# Patient Record
Sex: Female | Born: 2000 | Smoking: Never smoker
Health system: Southern US, Community
[De-identification: ages and names within clinical notes are randomized; demographics above are authoritative.]

## PROBLEM LIST (undated history)

## (undated) DIAGNOSIS — E282 Polycystic ovarian syndrome: Secondary | ICD-10-CM

---
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
## 2019-01-26 ENCOUNTER — Other Ambulatory Visit (HOSPITAL_COMMUNITY): Payer: Self-pay | Admitting: Gastroenterology

## 2019-01-26 ENCOUNTER — Other Ambulatory Visit: Payer: Self-pay | Admitting: Gastroenterology

## 2019-01-26 DIAGNOSIS — R1011 Right upper quadrant pain: Secondary | ICD-10-CM

## 2019-02-11 ENCOUNTER — Other Ambulatory Visit: Payer: Self-pay

## 2019-02-11 ENCOUNTER — Ambulatory Visit (HOSPITAL_COMMUNITY)
Admission: RE | Admit: 2019-02-11 | Discharge: 2019-02-11 | Disposition: A | Discharge status: Home / Self Care | Payer: 59 | Source: Ambulatory Visit | Attending: Gastroenterology | Admitting: Gastroenterology

## 2019-02-11 DIAGNOSIS — R1011 Right upper quadrant pain: Secondary | ICD-10-CM | POA: Insufficient documentation

## 2019-02-22 ENCOUNTER — Other Ambulatory Visit (HOSPITAL_COMMUNITY): Payer: Self-pay | Admitting: Gastroenterology

## 2019-02-22 ENCOUNTER — Other Ambulatory Visit: Payer: Self-pay | Admitting: Gastroenterology

## 2019-02-22 DIAGNOSIS — R110 Nausea: Secondary | ICD-10-CM

## 2019-03-04 ENCOUNTER — Encounter (HOSPITAL_COMMUNITY): Payer: 59

## 2019-03-04 ENCOUNTER — Encounter (HOSPITAL_COMMUNITY): Payer: Self-pay

## 2019-03-30 ENCOUNTER — Other Ambulatory Visit (HOSPITAL_COMMUNITY): Payer: Self-pay | Admitting: Gastroenterology

## 2019-03-30 DIAGNOSIS — R110 Nausea: Secondary | ICD-10-CM

## 2019-04-12 DIAGNOSIS — E282 Polycystic ovarian syndrome: Secondary | ICD-10-CM | POA: Insufficient documentation

## 2019-04-13 DIAGNOSIS — A749 Chlamydial infection, unspecified: Secondary | ICD-10-CM | POA: Insufficient documentation

## 2019-04-20 ENCOUNTER — Encounter: Payer: Self-pay | Admitting: Certified Nurse Midwife

## 2019-05-02 ENCOUNTER — Encounter (HOSPITAL_COMMUNITY): Payer: Self-pay

## 2019-05-02 ENCOUNTER — Encounter (HOSPITAL_COMMUNITY): Payer: 59

## 2019-11-15 DIAGNOSIS — M0760 Enteropathic arthropathies, unspecified site: Secondary | ICD-10-CM | POA: Insufficient documentation

## 2019-11-22 ENCOUNTER — Other Ambulatory Visit: Payer: Self-pay | Admitting: Oncology

## 2019-11-22 DIAGNOSIS — G459 Transient cerebral ischemic attack, unspecified: Secondary | ICD-10-CM

## 2019-12-11 ENCOUNTER — Ambulatory Visit
Admission: RE | Admit: 2019-12-11 | Discharge: 2019-12-11 | Disposition: A | Discharge status: Home / Self Care | Payer: 59 | Source: Ambulatory Visit | Attending: Oncology | Admitting: Oncology

## 2019-12-11 ENCOUNTER — Other Ambulatory Visit: Payer: Self-pay

## 2019-12-11 DIAGNOSIS — G459 Transient cerebral ischemic attack, unspecified: Secondary | ICD-10-CM

## 2019-12-11 MED ORDER — GADOBENATE DIMEGLUMINE 529 MG/ML IV SOLN
15.0000 mL | Freq: Once | INTRAVENOUS | Status: AC | PRN
  Administered 2019-12-11: 15 mL via INTRAVENOUS

## 2019-12-15 ENCOUNTER — Telehealth: Payer: Self-pay | Admitting: Hematology and Oncology

## 2019-12-15 NOTE — Telephone Encounter (Signed)
I received a call from the pt mom who is wanting a 2nd opinion for abdominal pain. Pt has been seen at [facility], records are in [facility]. She has been scheduled to see Dr. Al Pimple on 11/19 at 10am. Aware to arrive 30 minutes early.

## 2019-12-16 ENCOUNTER — Encounter: Payer: Self-pay | Admitting: Hematology and Oncology

## 2019-12-16 ENCOUNTER — Inpatient Hospital Stay
Payer: No Typology Code available for payment source | Attending: Hematology and Oncology | Admitting: Hematology and Oncology

## 2019-12-16 ENCOUNTER — Inpatient Hospital Stay: Payer: No Typology Code available for payment source

## 2019-12-16 ENCOUNTER — Telehealth: Payer: Self-pay

## 2019-12-16 ENCOUNTER — Other Ambulatory Visit: Payer: No Typology Code available for payment source

## 2019-12-16 ENCOUNTER — Other Ambulatory Visit: Payer: Self-pay

## 2019-12-16 VITALS — BP 104/69 | HR 76 | Temp 98.1°F | Resp 18 | Ht 67.0 in | Wt 181.0 lb

## 2019-12-16 DIAGNOSIS — E282 Polycystic ovarian syndrome: Secondary | ICD-10-CM | POA: Diagnosis not present

## 2019-12-16 DIAGNOSIS — R590 Localized enlarged lymph nodes: Secondary | ICD-10-CM

## 2019-12-16 DIAGNOSIS — F419 Anxiety disorder, unspecified: Secondary | ICD-10-CM | POA: Diagnosis not present

## 2019-12-16 DIAGNOSIS — Z803 Family history of malignant neoplasm of breast: Secondary | ICD-10-CM | POA: Insufficient documentation

## 2019-12-16 DIAGNOSIS — R112 Nausea with vomiting, unspecified: Secondary | ICD-10-CM | POA: Insufficient documentation

## 2019-12-16 DIAGNOSIS — R161 Splenomegaly, not elsewhere classified: Secondary | ICD-10-CM | POA: Insufficient documentation

## 2019-12-16 DIAGNOSIS — R197 Diarrhea, unspecified: Secondary | ICD-10-CM | POA: Diagnosis not present

## 2019-12-16 DIAGNOSIS — R109 Unspecified abdominal pain: Secondary | ICD-10-CM | POA: Insufficient documentation

## 2019-12-16 DIAGNOSIS — R6881 Early satiety: Secondary | ICD-10-CM | POA: Diagnosis not present

## 2019-12-16 LAB — CBC WITH DIFFERENTIAL/PLATELET
Abs Immature Granulocytes: 0.01 10*3/uL (ref 0.00–0.07)
Basophils Absolute: 0 10*3/uL (ref 0.0–0.1)
Basophils Relative: 1 %
Eosinophils Absolute: 0.1 10*3/uL (ref 0.0–0.5)
Eosinophils Relative: 1 %
HCT: 39.1 % (ref 36.0–46.0)
Hemoglobin: 13.6 g/dL (ref 12.0–15.0)
Immature Granulocytes: 0 %
Lymphocytes Relative: 29 %
Lymphs Abs: 1.9 10*3/uL (ref 0.7–4.0)
MCH: 30.5 pg (ref 26.0–34.0)
MCHC: 34.8 g/dL (ref 30.0–36.0)
MCV: 87.7 fL (ref 80.0–100.0)
Monocytes Absolute: 0.3 10*3/uL (ref 0.1–1.0)
Monocytes Relative: 4 %
Neutro Abs: 4.2 10*3/uL (ref 1.7–7.7)
Neutrophils Relative %: 65 %
Platelets: 185 10*3/uL (ref 150–400)
RBC: 4.46 MIL/uL (ref 3.87–5.11)
RDW: 11.9 % (ref 11.5–15.5)
WBC: 6.5 10*3/uL (ref 4.0–10.5)
nRBC: 0 % (ref 0.0–0.2)

## 2019-12-16 LAB — COMPREHENSIVE METABOLIC PANEL
ALT: 8 U/L (ref 0–44)
AST: 12 U/L — ABNORMAL LOW (ref 15–41)
Albumin: 3.7 g/dL (ref 3.5–5.0)
Alkaline Phosphatase: 59 U/L (ref 38–126)
Anion gap: 8 (ref 5–15)
BUN: 6 mg/dL (ref 6–20)
CO2: 24 mmol/L (ref 22–32)
Calcium: 9.1 mg/dL (ref 8.9–10.3)
Chloride: 108 mmol/L (ref 98–111)
Creatinine, Ser: 0.78 mg/dL (ref 0.44–1.00)
GFR, Estimated: >60 mL/min (ref >60)
Glucose, Bld: 98 mg/dL (ref 70–99)
Potassium: 4.1 mmol/L (ref 3.5–5.1)
Sodium: 140 mmol/L (ref 135–145)
Total Bilirubin: 0.5 mg/dL (ref 0.3–1.2)
Total Protein: 7.6 g/dL (ref 6.5–8.1)

## 2019-12-16 LAB — LACTATE DEHYDROGENASE: LDH: 149 U/L (ref 98–192)

## 2019-12-16 LAB — SEDIMENTATION RATE: Sed Rate: 14 mm/hr (ref 0–22)

## 2019-12-16 NOTE — Progress Notes (Signed)
[name]  Patient Care Team: Andrea Morning, DO as PCP - General (Family Medicine)  CHIEF COMPLAINTS/PURPOSE OF CONSULTATION:  Abdominal pain, nausea, vomiting and diarrhea.  ASSESSMENT & PLAN:  No problem-specific Assessment & Plan notes found for this encounter.  This is a very pleasant 19 year old female patient who arrived with her mom for second opinion regarding her recent abdominal imaging findings which showed extensive shotty mesenteric lymphadenopathy and some mild splenomegaly.  She went to see a hematologist at [facility] who did not think this was pathologic in nature and I recommended her to follow-up with gynecology and ordered some other labs to investigate.  She apparently was dissatisfied with the visit and was seeking additional care and hence made a follow-up here.  Ms. Andrea Cardenas tells me that she has not seen her primary care doctor in a long time does not see her gastroenterologist anymore.  She tells me that her nausea vomiting and diarrhea started about 4 years ago followed by left upper quadrant abdominal pain in her own words about 2 years ago.  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  Besides abdominal symptoms, she has lost some weight about 80 pounds 2 years ago. Examination today healthy-appearing female patient, no palpable cervical axillary inguinal lymphadenopathy.  No palpable splenomegaly or hepatomegaly.  Abdomen nontender on examination, no guarding or rigidity. I reviewed her labs completely normal CBC, CMP with slightly low albumin.  Some gingival changes are likely nutritional deficiencies.  She had extensive lab work-up done including autoimmune work-up, hypercoagulable work-up, most of the tests quite unremarkable, mildly elevated ESR noted at 29. She had CT abdominal imaging with renal protocol when she presented with acute  onset left upper quadrant abdominal pain which is worse than her baseline pain.  She was noted to have extensive shotty mesenteric lymphadenopathy of uncertain nature, likely reactive but repeat imaging was recommended to rule out malignancy.  She was also noted to have borderline splenomegaly measuring about 14 cm.  No imaging for comparison in our system but patient tells me that she had scan in [facility], ultrasound of the abdomen which have requested a copy of from her to compare.  Again she is quite new in our system hence it is hard to assess her previous weight and weight loss.  She and her mom are understandably frustrated about going from doctor to doctor without finding a cause for her symptoms, had an endoscopy and colonoscopy which were unremarkable according to the patient.  Patient became quite emotional when I tried to explain to her that we cannot prescribe narcotics without a clear reason for her pain and she may have to seek pain management referral if she would end up needing pain medication on a routine basis.  I have however promised her to investigate this a bit more with repeat imaging in December to look at the status of the lymphadenopathy and splenomegaly, ESR, CBC and heavy metal screen.  At this time my concern for malignancy is quite low.  Symptoms are chronic and she does not have any classical presentation of a lymphomatous process, CBC is completely normal.  I do believe that she may have some process which is noncancerous but unfortunately I cannot commit to prescribing narcotics unless the process is clearly hematological oncologic.  I tried to explain this to her.  I did encourage her to establish with another PCP for coordination of care.  Plan  Labs today Repeat CT chest abdomen pelvis with contrast imaging in December, encouraged hydration and discussed the risk of nephrotoxicity with diet.  She is agreeable to proceed. Request pain management referral from PCP, we  have given her some names for pain management doctors. If repeat imaging without any major concern for malignancy, would recommend that she follow-up with her primary care physician on a routine basis and return to hematology or oncology as needed. She had a question If she needed a splenectomy, I do not advocate for this at this time since I cannot correlate the cause of pain to a slightly larger spleen. Encouraged multivitamin supplementation given her poor oral intake Patient did not have genetic testing according to mom.  This may have to be addressed at a later date. Again the lack of malignant process at this time does not confer guarantee that she may not develop cancerous process at a later date.  She expressed understanding of all recommendations and will return to clinic in January.    Orders Placed This Encounter  Procedures  . CT CHEST ABDOMEN PELVIS W CONTRAST    Standing Status:   Future    Standing Expiration Date:   12/15/2020    Order Specific Question:   Preferred imaging location?    Answer:   [facility]    Order Specific Question:   Radiology Contrast Protocol - do NOT remove file path    Answer:   [email or web address]    Order Specific Question:   Is patient pregnant?    Answer:   No  . CBC with Differential/Platelet    Standing Status:   Standing    Number of Occurrences:   22    Standing Expiration Date:   12/15/2020  . Sedimentation rate    Standing Status:   Future    Number of Occurrences:   1    Standing Expiration Date:   12/15/2020  . Comprehensive metabolic panel    Standing Status:   Standing    Number of Occurrences:   33    Standing Expiration Date:   12/15/2020  . Heavy metals, blood    Standing Status:   Future    Number of Occurrences:   1    Standing Expiration Date:   12/15/2020    Order Specific Question:   [place] of residence?    Answer:   [place] [[id number]]  . Lactate dehydrogenase    Standing  Status:   Future    Number of Occurrences:   1    Standing Expiration Date:   12/15/2020     HISTORY OF PRESENTING ILLNESS:  Andrea Cardenas 19 y.o. female who was previously seen by my colleague Dr Federico Flake presents today for second opinion given some shotty lymphadenopathy and mild splenomegaly mentioned in one of the abdominal imaging.  This is a very pleasant 19 year old female patient who arrived with her mom to the appointment today for a second opinion.  She has seen Dr Federico Flake at [facility] in October 2021 given some abdominal imaging discussing about mesenteric lymphadenopathy and slightly large spleen.  She was dissatisfied with the visit because there was no clear solution to her symptoms she was referred to gynecology who did not know why she was referred.  She is a good historian.  She tells me that about 4 years ago was when she started noticing some abdominal symptoms which  she describes mostly as nausea, vomiting, diarrhe about once or twice a day with some bright red blood in her sleep.  She tells me that the nausea when she wakes up but can occasionally be in the middle of the night.  She has some vomiting once to thrice a week.  Sometimes the vomitus has food that she ate 24 hours ago versus can be just bile.  She associates nausea with food intake, eats very less because of this, lost about 80 pounds about 2 years ago because she was unable to eat very much. She then started having some abdominal pain in the left upper quadrant in her own words about 2 yrs ago. Again, it can be sharp or dull, variable, intensity 6/10 at minimum and 8/10 at maximum, severely impairing quality of life. Pain is aggravated by food, no specific relieving factors. She attributes blood in stool to hemorrhoids. Besides the above mentioned, she mentioned early satiety, but when asked to clarify if she doesn't eat because of nausea or because she feels full early, she said it varies. She says nothing works  except tramadol. She took tramadol in the past and 10 pills lasted her a month, and she was hoping we can give her some medication today. No international travel. No known heavy metal exposure. She mentioned and wondered why she has a spleen scar. She had endoscopy and colonoscopy which according to the patient showed no findings. She has well controlled anxiety, she says,mom and patient denies any stressors. Rest of the pertinent ROS reviewed and negative  [facility] Mother alive 61 CHEK2 positive breast cancer Father alive 3 doesn't go to doctors Brother alive 44 autism. Sister alive 60 well.   PAST MEDICAL HISTORY Past Medical History:  Diagnosis Date  . Chronic abdominal pain  . PCOS (polycystic ovarian syndrome)   SURGICAL HISTORY Past Surgical History:  Procedure Laterality Date  . WISDOM TOOTH EXTRACTION    REVIEW OF SYSTEMS:    Constitutional: Denies fevers, chills or abnormal night sweats Eyes: Denies blurriness of vision, double vision or watery eyes Ears, nose, mouth, throat, and face: Denies mucositis or sore throat Respiratory: Denies cough, dyspnea or wheezes Cardiovascular: Denies palpitation, chest discomfort or lower extremity swelling Gastrointestinal:  As mentioned in HPI Skin: Denies abnormal skin rashes Lymphatics: Denies new lymphadenopathy or easy bruising Neurological:Denies numbness, tingling or new weaknesses Behavioral/Psych: Mood is stable, no new changes, was tearful and emotional when she said no body would help her pain. All other systems were reviewed with the patient and are negative.  MEDICAL HISTORY:  History reviewed. No pertinent past medical history.  SURGICAL HISTORY: History reviewed. No pertinent surgical history.  SOCIAL HISTORY: Social History   Socioeconomic History  . Marital status: Unknown    Spouse name: Not on file  . Number of children: Not on file  . Years of education: Not on file  . Highest education level: Not on  file  Occupational History  . Not on file  Tobacco Use  . Smoking status: Never Smoker  Substance and Sexual Activity  . Alcohol use: Not on file  . Drug use: Not on file  . Sexual activity: Not on file  Other Topics Concern  . Not on file  Social History Narrative  . Not on file   Social Determinants of Health   Financial Resource Strain:   . Difficulty of Paying Living Expenses: Not on file  Food Insecurity:   . Worried About Charity fundraiser in  the Last Year: Not on file  . Ran Out of Food in the Last Year: Not on file  Transportation Needs:   . Lack of Transportation (Medical): Not on file  . Lack of Transportation (Non-Medical): Not on file  Physical Activity:   . Days of Exercise per Week: Not on file  . Minutes of Exercise per Session: Not on file  Stress:   . Feeling of Stress : Not on file  Social Connections:   . Frequency of Communication with Friends and Family: Not on file  . Frequency of Social Gatherings with Friends and Family: Not on file  . Attends Religious Services: Not on file  . Active Member of Clubs or Organizations: Not on file  . Attends Archivist Meetings: Not on file  . Marital Status: Not on file  Intimate Partner Violence:   . Fear of Current or Ex-Partner: Not on file  . Emotionally Abused: Not on file  . Physically Abused: Not on file  . Sexually Abused: Not on file    FAMILY HISTORY:  [facility] Mother alive 48 CHEK2 positive breast cancer Father alive 34 doesn't go to doctors Brother alive 51 autism. Sister alive 31 well.   ALLERGIES:  has No Known Allergies.  MEDICATIONS:  Current Outpatient Medications  Medication Sig Dispense Refill  . etonogestrel-ethinyl estradiol (NUVARING) 0.12-0.015 MG/24HR vaginal ring Place vaginally.     No current facility-administered medications for this visit.     PHYSICAL EXAMINATION: ECOG PERFORMANCE STATUS: 0 - Asymptomatic  Vitals:   12/16/19 0931  BP: 104/69  Pulse: 76   Resp: 18  Temp: 98.1 F (36.7 C)  SpO2: 100%   Filed Weights   12/16/19 0931  Weight: 181 lb (82.1 kg)    GENERAL:alert, no distress and comfortable SKIN: skin color, texture, turgor are normal, no rashes or significant lesions EYES: normal, conjunctiva are pink and non-injected, sclera clear OROPHARYNX:no exudate, no erythema and lips, buccal mucosa, and tongue normal  NECK: supple, thyroid normal size, non-tender, without nodularity LYMPH:  no palpable lymphadenopathy in the cervical, axillary or inguinal LUNGS: clear to auscultation and percussion with normal breathing effort HEART: regular rate & rhythm and no murmurs and no lower extremity edema ABDOMEN:abdomen soft, non-tender and normal bowel sounds Musculoskeletal:no cyanosis of digits and no clubbing  PSYCH: alert & oriented x 3 with fluent speech NEURO: no focal motor/sensory deficits  LABORATORY DATA:  I have reviewed the data as listed Lab Results  Component Value Date   WBC 6.5 12/16/2019   HGB 13.6 12/16/2019   HCT 39.1 12/16/2019   MCV 87.7 12/16/2019   PLT 185 12/16/2019     Chemistry      Component Value Date/Time   NA 140 12/16/2019 1054   K 4.1 12/16/2019 1054   CL 108 12/16/2019 1054   CO2 24 12/16/2019 1054   BUN 6 12/16/2019 1054   CREATININE 0.78 12/16/2019 1054      Component Value Date/Time   CALCIUM 9.1 12/16/2019 1054   ALKPHOS 59 12/16/2019 1054   AST 12 (L) 12/16/2019 1054   ALT 8 12/16/2019 1054   BILITOT 0.5 12/16/2019 1054      RADIOGRAPHIC STUDIES: I have personally reviewed the radiological images as listed and agreed with the findings in the report. MR BRAIN W WO CONTRAST  Result Date: 12/11/2019 CLINICAL DATA:  Dizziness and episodes of peripheral vision loss. EXAM: MRI HEAD WITHOUT AND WITH CONTRAST TECHNIQUE: Multiplanar, multiecho pulse sequences of the brain and  surrounding structures were obtained without and with intravenous contrast. CONTRAST:  5m MULTIHANCE  GADOBENATE DIMEGLUMINE 529 MG/ML IV SOLN COMPARISON:  None. FINDINGS: Brain: No acute infarction, hemorrhage, hydrocephalus, extra-axial collection or mass lesion. The brain parenchyma has normal morphology and signal characteristics. No focus of abnormal contrast enhancement. Vascular: Normal flow voids. Skull and upper cervical spine: Normal marrow signal. Sinuses/Orbits: Mild mucosal thickening scattered throughout the paranasal sinuses, more pronounced in the bilateral ethmoid cells. The orbits are maintained. Other: None. IMPRESSION: 1. Unremarkable MRI of the brain. 2. Mild paranasal sinus disease. Electronically Signed   By: KPedro EarlsM.D.   On: 12/11/2019 18:38   11/04/19, CT abd pelvis renal protocol  This result has an attachment that is not available.  CLINICAL DATA: Left flank pain   CT Abdomen Pelvis Without Contrast (Renal Protocol)  [facility] Extensive shotty mesenteric adenopathy. This may be reactive,  inflammatory, or lymphoproliferative in nature. Given the associated  splenomegaly, short-term imaging follow-up may be helpful to  document resolution and exclude underlying neoplasm..    Electronically Signed  By: AFidela SalisburyMD  On: 11/04/2019 19:41   EXAM:  CT ABDOMEN AND PELVIS WITHOUT CONTRAST   TECHNIQUE:  Multidetector CT imaging of the abdomen and pelvis was performed  following the standard protocol without IV contrast.   COMPARISON: None.   FINDINGS:  Lower chest: The visualized lung bases are clear. The visualized  heart and pericardium are unremarkable peer   Hepatobiliary: No focal liver abnormality is seen. No gallstones,  gallbladder wall thickening, or biliary dilatation.   Pancreas: Unremarkable   Spleen: The spleen is mildly enlarged measuring 14.0 cm in greatest  dimension, best seen on coronal imaging. No intrasplenic lesion is  identified on this noncontrast examination.    Adrenals/Urinary Tract: Adrenal glands are unremarkable. Kidneys are  normal, without renal calculi, focal lesion, or hydronephrosis.  Bladder is unremarkable.   Stomach/Bowel: Stomach, small bowel, and large bowel are  unremarkable. Appendix normal. Trace free fluid within the pelvis is  nonspecific and may be physiologic in a female patient of this age.  No free intraperitoneal gas.   Vascular/Lymphatic: There is extensive shotty mesenteric adenopathy  with multiple lymph nodes measuring up to 10-11 mm in short axis  diameter. This is nonspecific and may be reactive, as can be seen  with infectious gastroenteritis, inflammatory, as can be seen with  mesenteric adenitis, or lymphoproliferative in nature. No pathologic  retroperitoneal or pelvic adenopathy is identified.   The abdominal vasculature is unremarkableon this noncontrast  examination.   Reproductive: A vaginal implant is in place. The pelvic organs are  otherwise unremarkable.   Other: Rectum unremarkable.   Musculoskeletal: No acute bone abnormality.   All questions were answered. The patient knows to call the clinic with any problems, questions or concerns. I spent a total of 45 minutes in the care of this patient including H and P, review of records, counseling and coordination of care.  [facility]     PBenay Pike MD 12/16/2019 11:59 AM

## 2019-12-16 NOTE — Telephone Encounter (Signed)
Pt called the Lab who relayed the message that pt needs a referral for pain management.  I spoke with the pt and advised her to follow-up and request a referral from her PCP. Pt states she has not seen her PCP in almost two years. I encouraged the pt to maybe schedule an appt or message her PCP's office with this request. Pt became upset and asked why we wouldn't just manage her pain. I reiterated to the pt that her PCP would manage her pain management and/or referral. Pt then abruptly disconnected the call.

## 2019-12-19 ENCOUNTER — Telehealth: Payer: Self-pay | Admitting: *Deleted

## 2019-12-19 NOTE — Telephone Encounter (Signed)
Attempted call to patient regarding sed rate results-norma. No answer but was able to leave vm message for pt to return call at her convenience.

## 2019-12-19 NOTE — Telephone Encounter (Signed)
-----   Message from Rachel Moulds, MD sent at 12/16/2019  2:25 PM EST ----- BethPlease let her know that her sed rate is normal now. Thanks,

## 2019-12-21 LAB — HEAVY METALS, BLOOD
Arsenic: 1 ug/L — ABNORMAL LOW (ref 2–23)
Lead: <1 ug/dL (ref 0–4)
Mercury: 1.4 ug/L (ref 0.0–14.9)

## 2020-01-13 DIAGNOSIS — M25561 Pain in right knee: Secondary | ICD-10-CM | POA: Insufficient documentation

## 2020-01-16 ENCOUNTER — Ambulatory Visit (HOSPITAL_COMMUNITY): Payer: No Typology Code available for payment source

## 2020-01-26 ENCOUNTER — Ambulatory Visit (HOSPITAL_COMMUNITY): Admission: RE | Admit: 2020-01-26 | Payer: No Typology Code available for payment source | Source: Ambulatory Visit

## 2020-02-02 ENCOUNTER — Telehealth: Payer: Self-pay

## 2020-02-02 ENCOUNTER — Other Ambulatory Visit: Payer: Self-pay

## 2020-02-02 DIAGNOSIS — R599 Enlarged lymph nodes, unspecified: Secondary | ICD-10-CM

## 2020-02-02 NOTE — Telephone Encounter (Signed)
The Patient called yesterday requesting to have her CT scan sent to [facility] instead of [facility]. I reordered the CT scan and sent that to [facility]. I advised the Patient to contact [facility] to schedule her CT scan and that we would need to cancel her appointment with Dr. Al Pimple set for tomorrow and reschedule for a time once the CT scan has been completed. The Patient verbalized understanding. A message has been sent to scheduling regarding this matter.

## 2020-02-02 NOTE — Progress Notes (Signed)
Opened in error

## 2020-02-03 ENCOUNTER — Inpatient Hospital Stay: Payer: No Typology Code available for payment source | Admitting: Hematology and Oncology

## 2020-02-21 ENCOUNTER — Ambulatory Visit
Admission: RE | Admit: 2020-02-21 | Discharge: 2020-02-21 | Disposition: A | Discharge status: Home / Self Care | Payer: No Typology Code available for payment source | Source: Ambulatory Visit | Attending: Hematology and Oncology | Admitting: Hematology and Oncology

## 2020-02-21 DIAGNOSIS — R599 Enlarged lymph nodes, unspecified: Secondary | ICD-10-CM

## 2020-02-21 MED ORDER — IOPAMIDOL (ISOVUE-300) INJECTION 61%
100.0000 mL | Freq: Once | INTRAVENOUS | Status: AC | PRN
  Administered 2020-02-21: 100 mL via INTRAVENOUS

## 2020-02-22 ENCOUNTER — Telehealth: Payer: Self-pay

## 2020-02-22 NOTE — Telephone Encounter (Signed)
Patient has been made aware of CT results and Dr. Remonia Richter message advising her to keep her appointment with [facility] to follow up there. The Patient verbalized understanding and states that she is feeling better.

## 2020-02-22 NOTE — Telephone Encounter (Signed)
-----   Message from Rachel Moulds, MD sent at 02/21/2020 10:32 PM EST ----- Baird Lyons, please convey the results to the patient. Splenomegaly is not concerning, mildly enlarged. Lymph nodes in the abdomen have significantly improved. At this time, I have no other recommendations but I am happy to help if they have any questions. I see they have an appointment at [facility] soon, I think that's good to keep it and follow there.

## 2021-05-04 IMAGING — MR MR HEAD WO/W CM
12 series · 48 of 48 positions shown · IV contrast (multihance)
Comparison: None.

CLINICAL DATA: Dizziness and episodes of peripheral vision loss.

EXAM:
MRI HEAD WITHOUT AND WITH CONTRAST
TECHNIQUE: Multiplanar, multiecho pulse sequences of the brain and surrounding
structures were obtained without and with intravenous contrast.
CONTRAST:  15mL MULTIHANCE GADOBENATE DIMEGLUMINE 529 MG/ML IV SOLN

[Series 2: t1_se_sag · sagittal · 5.0mm · 0.45mm/px · 1 of 27 slices shown]
[im 1/27]
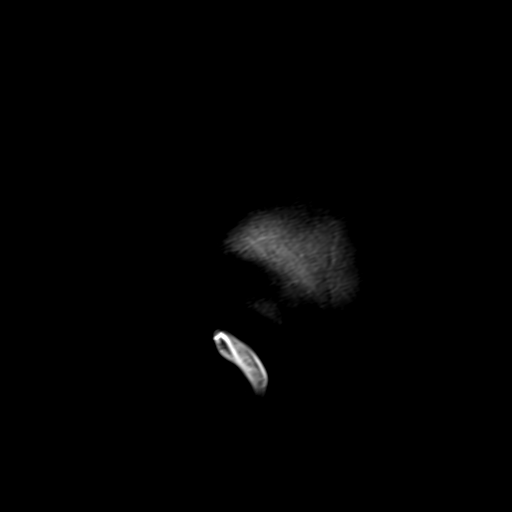

[Series 3: ep2d_diff_3 · axial · 3.0mm · 1.80mm/px · z∈[-59,+88]mm · 5 of 99 slices shown]
[im 1/99]
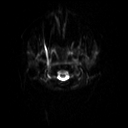
[im 25/99]
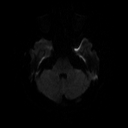
[im 50/99]
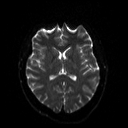
[im 74/99]
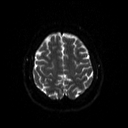
[im 99/99]
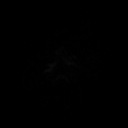

[Series 4: ep2d_diff_3_adc · axial · 3.0mm · 1.80mm/px · z∈[-59,+88]mm · 3 of 50 slices shown]
[im 1/50]
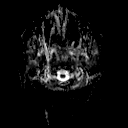
[im 25/50]
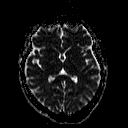
[im 50/50]
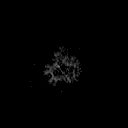

[Series 5: ep2d_diff_cor · coronal · 5.0mm · 1.77mm/px · 3 of 54 slices shown]
[im 1/54]
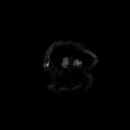
[im 27/54]
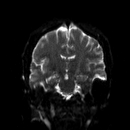
[im 54/54]
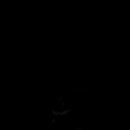

[Series 6: ep2d_diff_cor_adc · coronal · 5.0mm · 1.77mm/px · 2 of 27 slices shown]
[im 1/27]
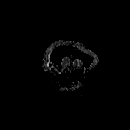
[im 27/27]
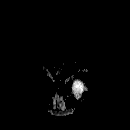

[Series 8: swi_images · axial · 2.0mm · 0.90mm/px · z∈[-58,+83]mm · 4 of 72 slices shown]
[im 1/72]
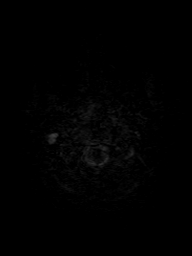
[im 24/72]
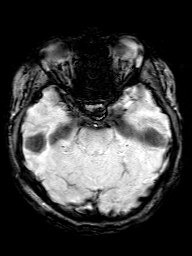
[im 48/72]
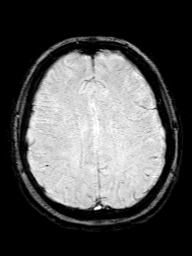
[im 72/72]
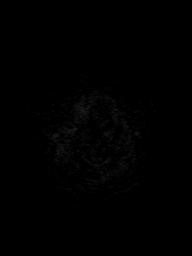

[Series 9: FLAIR · axial · 3.0mm · 0.43mm/px · z∈[-65,+90]mm · 2 of 27 slices shown]
[im 1/27]
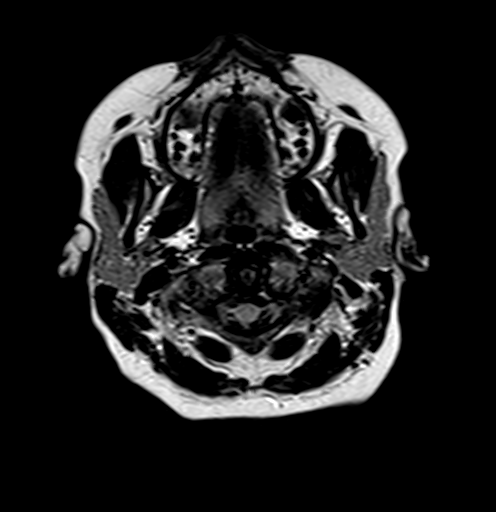
[im 27/27]
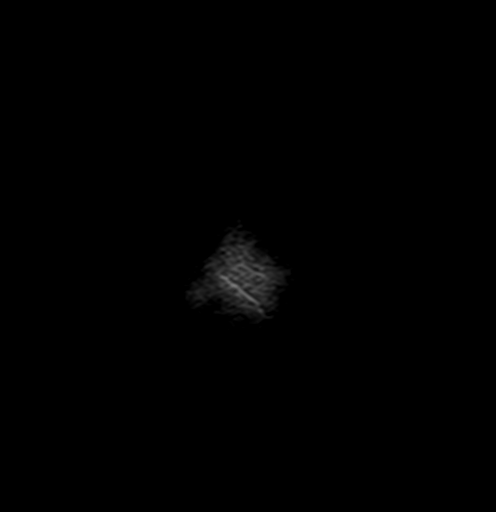

[Series 10: t2_tse_tra_512 · axial · 5.0mm · 0.60mm/px · z∈[-65,+90]mm · 2 of 27 slices shown]
[im 1/27]
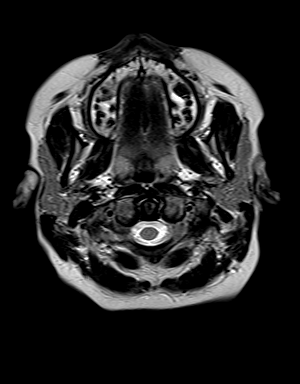
[im 27/27]
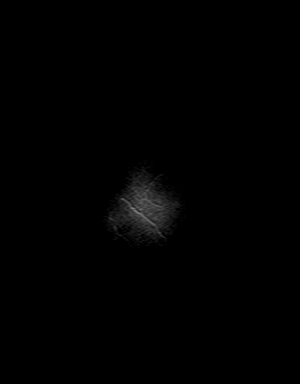

[Series 11: t1_mpr_tra · axial · 1.0mm · 0.72mm/px · z∈[-75,+100]mm · 11 of 176 slices shown]
[im 1/176]
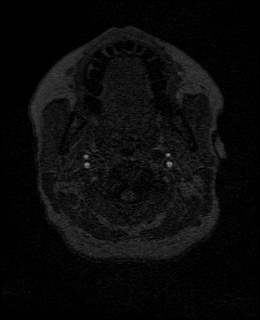
[im 18/176]
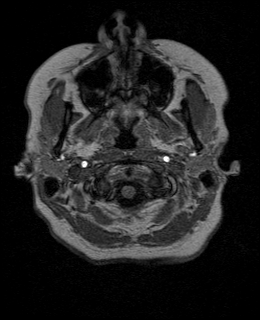
[im 36/176]
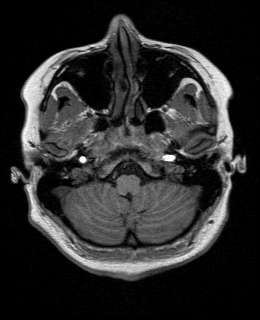
[im 53/176]
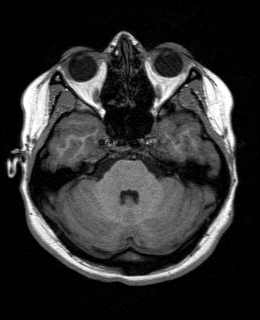
[im 71/176]
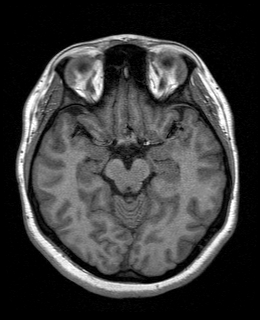
[im 88/176]
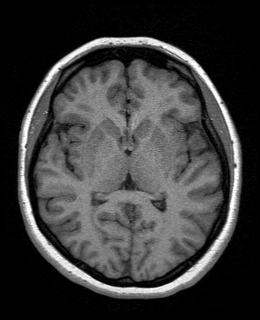
[im 106/176]
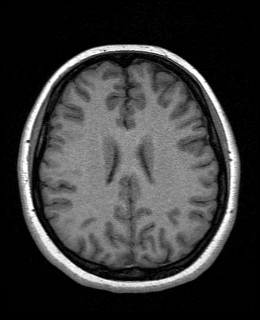
[im 123/176]
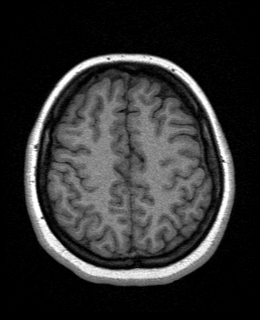
[im 141/176]
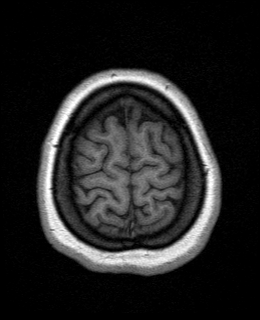
[im 158/176]
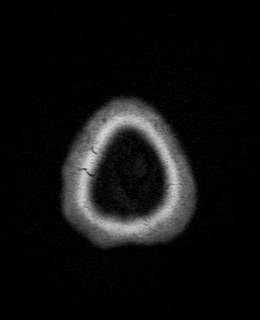
[im 176/176]
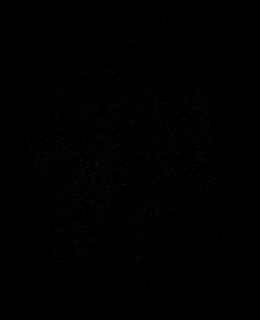

[Series 12: T2 · coronal · 5.0mm · 0.45mm/px · 2 of 30 slices shown]
[im 1/30]
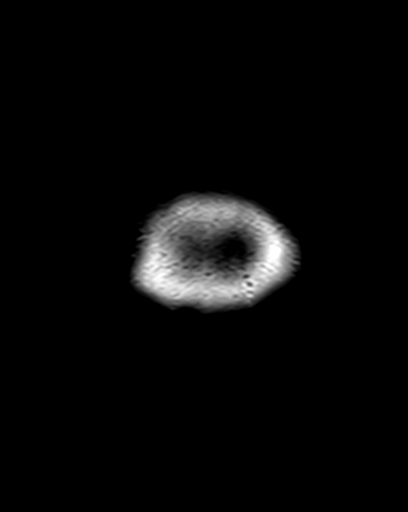
[im 30/30]
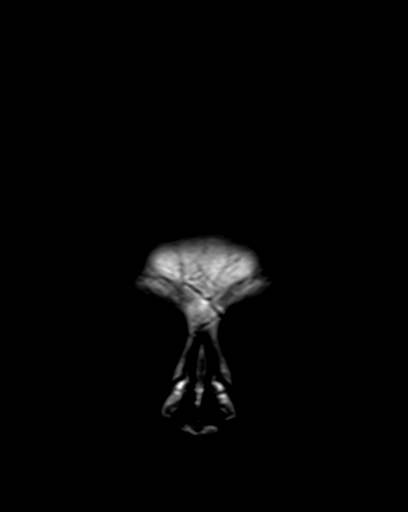

[Series 13: T1 post-contrast · coronal · 5.0mm · 0.72mm/px · 2 of 30 slices shown]
[im 1/30]
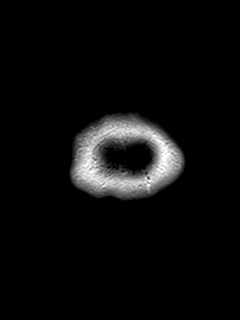
[im 30/30]
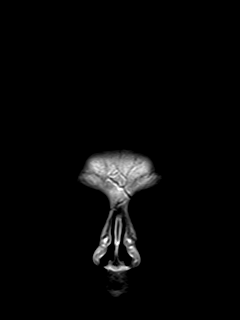

[Series 14: post t1_mpr_tra · axial · 1.0mm · 0.72mm/px · z∈[-75,+100]mm · 11 of 176 slices shown]
[im 1/176]
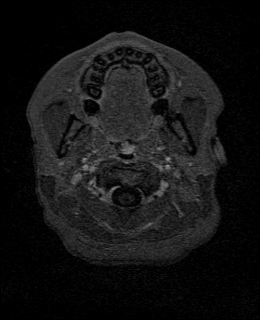
[im 18/176]
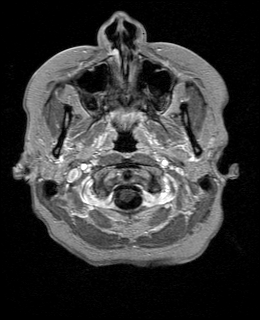
[im 36/176]
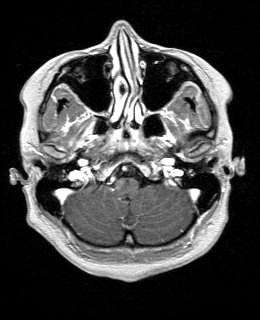
[im 53/176]
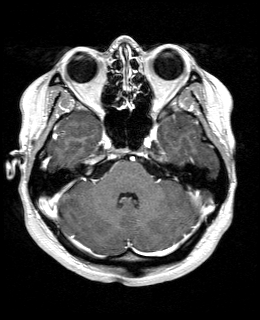
[im 71/176]
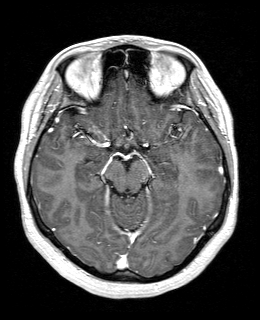
[im 88/176]
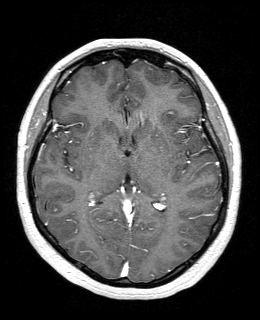
[im 106/176]
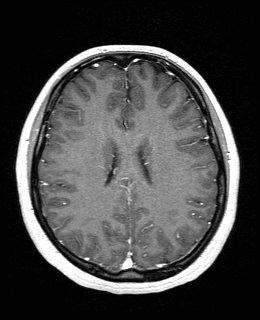
[im 123/176]
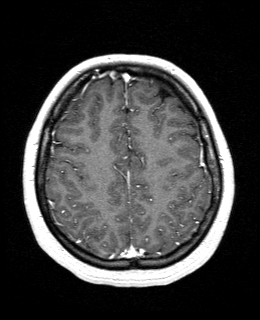
[im 141/176]
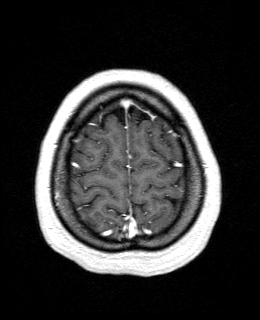
[im 158/176]
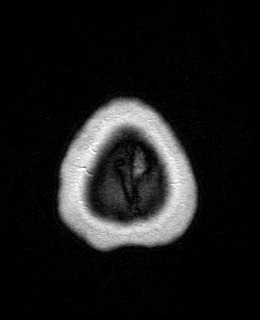
[im 176/176]
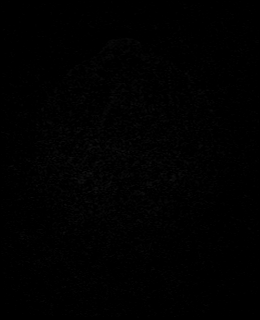

[48 of 48 positions shown; findings below may reference images not displayed]

FINDINGS: Brain: No acute infarction, hemorrhage, hydrocephalus, extra-axial
collection or mass lesion. The brain parenchyma has normal
morphology and signal characteristics. No focus of abnormal contrast
enhancement.

Vascular: Normal flow voids.

Skull and upper cervical spine: Normal marrow signal.

Sinuses/Orbits: Mild mucosal thickening scattered throughout the
paranasal sinuses, more pronounced in the bilateral ethmoid cells.
The orbits are maintained.

Other: None.
IMPRESSION: 1. Unremarkable MRI of the brain.
2. Mild paranasal sinus disease.

## 2022-03-13 NOTE — Progress Notes (Deleted)
  Andrea Cardenas Andrea Cardenas Andrea Cardenas Phone: [phone] Subjective:    I'm seeing this patient by the request  of:  Penni Bombard, PA  CC: joint pain   [id number]  Andrea Cardenas is a 22 y.o. female coming in with complaint of hypermobility  Onset-  Location Duration-  Character- Aggravating factors- Reliving factors-  Therapies tried-  Severity-   Ct scan in Jan showed decreasing lymph node size in 2022. Sed rate normal autoimmune labs in 2021 normal  On cymbalta   No past medical history on file. No past surgical history on file. Social History   Socioeconomic History   Marital status: Unknown    Spouse name: Not on file   Number of children: Not on file   Years of education: Not on file   Highest education level: Not on file  Occupational History   Not on file  Tobacco Use   Smoking status: Never   Smokeless tobacco: Not on file  Substance and Sexual Activity   Alcohol use: Not on file   Drug use: Not on file   Sexual activity: Not on file  Other Topics Concern   Not on file  Social History Narrative   Not on file   Social Determinants of Health   Financial Resource Strain: Not on file  Food Insecurity: Not on file  Transportation Needs: Not on file  Physical Activity: Not on file  Stress: Not on file  Social Connections: Not on file   No Known Allergies No family history on file.  Current Outpatient Medications (Endocrine & Metabolic):    etonogestrel-ethinyl estradiol (NUVARING) 0.12-0.015 MG/24HR vaginal ring, Place vaginally.        Reviewed prior external information including notes and imaging from  primary care provider As well as notes that were available from care everywhere and other healthcare systems.  Past medical history, social, surgical and family history all reviewed in electronic medical record.  No pertanent information unless stated regarding to the chief complaint.    Review of Systems:  No headache, visual changes, nausea, vomiting, diarrhea, constipation, dizziness, abdominal pain, skin rash, fevers, chills, night sweats, weight loss, swollen lymph nodes, body aches, joint swelling, chest pain, shortness of breath, mood changes. POSITIVE muscle aches  Objective  There were no vitals taken for this visit.   General: No apparent distress alert and oriented x3 mood and affect normal, dressed appropriately.  HEENT: Pupils equal, extraocular movements intact  Respiratory: Patient's speak in full sentences and does not appear short of breath  Cardiovascular: No lower extremity edema, non tender, no erythema      Impression and Recommendations:

## 2022-03-17 DIAGNOSIS — G43109 Migraine with aura, not intractable, without status migrainosus: Secondary | ICD-10-CM | POA: Insufficient documentation

## 2022-03-17 DIAGNOSIS — N3281 Overactive bladder: Secondary | ICD-10-CM | POA: Insufficient documentation

## 2022-03-17 DIAGNOSIS — F909 Attention-deficit hyperactivity disorder, unspecified type: Secondary | ICD-10-CM | POA: Insufficient documentation

## 2022-03-18 ENCOUNTER — Ambulatory Visit: Payer: 59 | Admitting: Family Medicine

## 2022-03-20 ENCOUNTER — Ambulatory Visit: Payer: Medicaid Other | Admitting: Cardiology

## 2022-04-24 ENCOUNTER — Ambulatory Visit: Payer: Medicaid Other | Admitting: Cardiology

## 2022-04-25 ENCOUNTER — Ambulatory Visit: Payer: Medicaid Other | Attending: Cardiology | Admitting: Cardiology

## 2022-11-01 ENCOUNTER — Encounter (HOSPITAL_COMMUNITY): Payer: Self-pay

## 2022-11-01 ENCOUNTER — Other Ambulatory Visit: Payer: Self-pay

## 2022-11-01 ENCOUNTER — Emergency Department (HOSPITAL_COMMUNITY)
Admission: EM | Admit: 2022-11-01 | Discharge: 2022-11-01 | Disposition: A | Discharge status: Home / Self Care | Payer: 59 | Attending: Emergency Medicine | Admitting: Emergency Medicine

## 2022-11-01 DIAGNOSIS — N939 Abnormal uterine and vaginal bleeding, unspecified: Secondary | ICD-10-CM | POA: Insufficient documentation

## 2022-11-01 DIAGNOSIS — N898 Other specified noninflammatory disorders of vagina: Secondary | ICD-10-CM

## 2022-11-01 HISTORY — DX: Polycystic ovarian syndrome: E28.2

## 2022-11-01 LAB — CBC WITH DIFFERENTIAL/PLATELET
Abs Immature Granulocytes: 0 10*3/uL (ref 0.00–0.07)
Basophils Absolute: 0 10*3/uL (ref 0.0–0.1)
Basophils Relative: 0 %
Eosinophils Absolute: 0.3 10*3/uL (ref 0.0–0.5)
Eosinophils Relative: 4 %
HCT: 38.1 % (ref 36.0–46.0)
Hemoglobin: 12.9 g/dL (ref 12.0–15.0)
Lymphocytes Relative: 20 %
Lymphs Abs: 1.7 10*3/uL (ref 0.7–4.0)
MCH: 31.2 pg (ref 26.0–34.0)
MCHC: 33.9 g/dL (ref 30.0–36.0)
MCV: 92 fL (ref 80.0–100.0)
Monocytes Absolute: 0.3 10*3/uL (ref 0.1–1.0)
Monocytes Relative: 4 %
Neutro Abs: 6 10*3/uL (ref 1.7–7.7)
Neutrophils Relative %: 72 %
Platelets: 204 10*3/uL (ref 150–400)
RBC: 4.14 MIL/uL (ref 3.87–5.11)
RDW: 12.2 % (ref 11.5–15.5)
WBC: 8.3 10*3/uL (ref 4.0–10.5)
nRBC: 0 % (ref 0.0–0.2)
nRBC: 0 /100{WBCs}

## 2022-11-01 LAB — BASIC METABOLIC PANEL
Anion gap: 12 (ref 5–15)
BUN: 9 mg/dL (ref 6–20)
CO2: 22 mmol/L (ref 22–32)
Calcium: 8.6 mg/dL — ABNORMAL LOW (ref 8.9–10.3)
Chloride: 104 mmol/L (ref 98–111)
Creatinine, Ser: 0.8 mg/dL (ref 0.44–1.00)
GFR, Estimated: >60 mL/min (ref >60)
Glucose, Bld: 90 mg/dL (ref 70–99)
Potassium: 3.7 mmol/L (ref 3.5–5.1)
Sodium: 138 mmol/L (ref 135–145)

## 2022-11-01 LAB — WET PREP, GENITAL
Clue Cells Wet Prep HPF POC: NONE SEEN
Sperm: NONE SEEN
Trich, Wet Prep: NONE SEEN
WBC, Wet Prep HPF POC: >=10 — AB (ref <10)
Yeast Wet Prep HPF POC: NONE SEEN

## 2022-11-01 LAB — RAPID HIV SCREEN (HIV 1/2 AB+AG)
HIV 1/2 Antibodies: NONREACTIVE
HIV-1 P24 Antigen - HIV24: NONREACTIVE

## 2022-11-01 LAB — POC URINE PREG, ED: Preg Test, Ur: NEGATIVE

## 2022-11-01 LAB — HCG, QUANTITATIVE, PREGNANCY: hCG, Beta Chain, Quant, S: <1 m[IU]/mL (ref <5)

## 2022-11-01 NOTE — ED Triage Notes (Signed)
Pt states she went to the bathroom this morning and noticed a piece of tissue with blood in the toilet. Pt states she has been having spotting for the past few weeks but had a negative pregnancy test at home. Pt went to [facility] and was told she was possibly having a miscarriage and was sent here. Pt denies abd pain.

## 2022-11-01 NOTE — Discharge Instructions (Addendum)
Please follow-up with your OB/GYN, if you do have an OB/GYN please follow-up with the [facility], or your primary care doctor.  The cause of your uterine bleeding, is unclear, there is no evidence of any kind of pregnancy, and we did test you for STDs.  If you are positive for gonorrhea or chlamydia, we will call you, and recommend that you get treated.  Return to the ER if you have heavy vaginal bleeding, pelvic pain, intractable nausea or vomiting or fever

## 2022-11-01 NOTE — ED Notes (Signed)
Lab called to add on UA to previously sent sample.

## 2022-11-01 NOTE — ED Provider Triage Note (Signed)
Emergency Medicine Provider Triage Evaluation Note  Andrea Cardenas , a 22 y.o. female  was evaluated in triage.  Pt complains of vaginal bleeding.  She reports missing her most recent period.  She did take a pregnancy test about 3 weeks ago that was negative.  She has had spotting over the past week with a larger "gush" of blood today.  She thinks that she may have passed some tissue.  She went to an urgent care and was told that she may be having a miscarriage and was directed to the emergency department.  No current pain or cramping although she has had some intermittent back pain and cramping over the past 1 week.  She uses NuvaRing for contraception.  Review of Systems  Positive: Vaginal bleeding Negative: Abdominal pain  Physical Exam  BP (!) 149/106 (BP Location: Right Arm)   Pulse 76   Temp 98.6 F (37 C)   Resp 20   Ht 5\' 6"  (1.676 m)   Wt 71.7 kg   LMP 07/23/2022 (Approximate) Comment: nuvaring for the past 2 months  SpO2 100%   BMI 25.50 kg/m  Gen:   Awake, no distress   Resp:  Normal effort  MSK:   Moves extremities without difficulty  Other:    Medical Decision Making  Medically screening exam initiated at 11:54 AM.  Appropriate orders placed.  Andrea Cardenas was informed that the remainder of the evaluation will be completed by another provider, this initial triage assessment does not replace that evaluation, and the importance of remaining in the ED until their evaluation is complete.  UPT pending   Renne Crigler, PA-C 11/01/22 1155

## 2022-11-01 NOTE — ED Provider Notes (Signed)
[place] EMERGENCY DEPARTMENT AT [facility] Provider Note   CSN: [id number] Arrival date & time: 11/01/22  1130     History  Chief Complaint  Patient presents with   Vaginal Bleeding    Andrea Cardenas is a 22 y.o. female, who presents to the ED secondary to concerns for miscarriage.  She states that she is sexually active, and has not had her period since the last week of June.  Since then she has started to being on the NuvaRing, which she was previously on.  Was having a little bit of discharge, but overall things were going well.  Was not having her period, she was skipping her menstruation week.  Notes that for the last week she has had spotting, and then today she felt a gush, when she went to the toilet.  Notes that she felt like she passed something that look like skin.  Went to urgent care and was told that she was likely having a miscarriage when she presented them with the tissue. Reports she had a little bit of cramping 2 days ago, but denies any current pain.    Home Medications Prior to Admission medications   Medication Sig Start Date End Date Taking? Authorizing Provider  etonogestrel-ethinyl estradiol (NUVARING) 0.12-0.015 MG/24HR vaginal ring Place vaginally.    [provider]      Allergies    Patient has no known allergies.    Review of Systems   Review of Systems  Genitourinary:  Positive for vaginal bleeding and vaginal discharge. Negative for difficulty urinating, dyspareunia and frequency.    Physical Exam Updated Vital Signs BP (!) 149/106 (BP Location: Right Arm)   Pulse 76   Temp 98.6 F (37 C)   Resp 20   Ht 5\' 6"  (1.676 m)   Wt 71.7 kg   LMP 07/23/2022 (Approximate) Comment: nuvaring for the past 2 months  SpO2 100%   BMI 25.50 kg/m  Physical Exam Vitals and nursing note reviewed. Exam conducted with a chaperone present.  Constitutional:      General: She is not in acute distress.    Appearance: She is  well-developed.  HENT:     Head: Normocephalic and atraumatic.  Eyes:     Conjunctiva/sclera: Conjunctivae normal.  Cardiovascular:     Rate and Rhythm: Normal rate and regular rhythm.     Heart sounds: No murmur heard. Pulmonary:     Effort: Pulmonary effort is normal. No respiratory distress.     Breath sounds: Normal breath sounds.  Abdominal:     Palpations: Abdomen is soft.     Tenderness: There is no abdominal tenderness.  Genitourinary:    Exam position: Knee-chest position.     Vagina: Normal.     Cervix: Discharge present. No cervical motion tenderness, friability, erythema or eversion.     Comments: +thick yellowish blood tinged discharge from cervix Musculoskeletal:        General: No swelling.     Cervical back: Neck supple.  Skin:    General: Skin is warm and dry.     Capillary Refill: Capillary refill takes less than 2 seconds.  Neurological:     Mental Status: She is alert.  Psychiatric:        Mood and Affect: Mood normal.     ED Results / Procedures / Treatments   Labs (all labs ordered are listed, but only abnormal results are displayed) Labs Reviewed  WET PREP, GENITAL - Abnormal; Notable for the  following components:      Result Value   WBC, Wet Prep HPF POC >=10 (*)    All other components within normal limits  BASIC METABOLIC PANEL - Abnormal; Notable for the following components:   Calcium 8.6 (*)    All other components within normal limits  CBC WITH DIFFERENTIAL/PLATELET  HCG, QUANTITATIVE, PREGNANCY  RAPID HIV SCREEN (HIV 1/2 AB+AG)  URINALYSIS, ROUTINE W REFLEX MICROSCOPIC  POC URINE PREG, ED  GC/CHLAMYDIA PROBE AMP ([facility]) NOT AT [facility]    EKG None  Radiology No results found.  Procedures Procedures    Medications Ordered in ED Medications - No data to display  ED Course/ Medical Decision Making/ A&P                                 Medical Decision Making Patient is a 22 year old female, here for vaginal bleeding,  abnormal tissue, concern for miscarriage.  Last menstrual period was in late June, I performed a pelvic exam, there is no foreign body in the vaginal cavity except for thick yellowish discharge, blood present.  I asked her if she wore tampons, and she states she is but she has been taking the amount religiously and is sure that 1 is not stuck in there.  I do not see any evidence of a string.  We will obtain beta-hCG, CMP, CBC for further evaluation.  Amount and/or Complexity of Data Reviewed Labs: ordered.    Details: Negative for pregnancy Discussion of management or test interpretation with external provider(s): Discussed with patient, negative wet prep, negative for pregnancy, as well, we sent GC chlamydia off, she states she is not concerned for having an STD, but would like to be tested.  We will have her follow-up with OB/GYN, mucous plug possible, with slight bleeding, breakthrough secondary to use of NuvaRing.  We discussed emergent return precautions and she voiced understanding    Final Clinical Impression(s) / ED Diagnoses Final diagnoses:  Abnormal uterine bleeding (AUB)  Vaginal discharge    Rx / DC Orders ED Discharge Orders     None         Adhrit Krenz, Harley [place], PA 11/01/22 1442    Derwood Kaplan, MD 11/02/22 [phone]

## 2022-11-03 LAB — GC/CHLAMYDIA PROBE AMP (~~LOC~~) NOT AT ARMC
Chlamydia: NEGATIVE
Comment: NEGATIVE
Comment: NORMAL
Neisseria Gonorrhea: NEGATIVE

## 2023-03-06 ENCOUNTER — Other Ambulatory Visit: Payer: Self-pay

## 2023-03-06 ENCOUNTER — Other Ambulatory Visit (HOSPITAL_BASED_OUTPATIENT_CLINIC_OR_DEPARTMENT_OTHER): Payer: Self-pay

## 2023-03-06 MED ORDER — LISDEXAMFETAMINE DIMESYLATE 60 MG PO CAPS
60.0000 mg | ORAL_CAPSULE | Freq: Every day | ORAL | 0 refills | Status: DC
  Filled 2023-03-06: qty 30, 30d supply, fill #0

## 2023-04-10 ENCOUNTER — Encounter (INDEPENDENT_AMBULATORY_CARE_PROVIDER_SITE_OTHER): Payer: Self-pay

## 2023-04-13 ENCOUNTER — Other Ambulatory Visit (HOSPITAL_BASED_OUTPATIENT_CLINIC_OR_DEPARTMENT_OTHER): Payer: Self-pay

## 2023-04-13 MED ORDER — LISDEXAMFETAMINE DIMESYLATE 60 MG PO CAPS
60.0000 mg | ORAL_CAPSULE | Freq: Every morning | ORAL | 0 refills | Status: DC
  Filled 2023-06-23: qty 30, 30d supply, fill #0

## 2023-04-13 MED ORDER — LISDEXAMFETAMINE DIMESYLATE 60 MG PO CAPS
60.0000 mg | ORAL_CAPSULE | Freq: Every morning | ORAL | 0 refills | Status: DC
  Filled 2023-04-13: qty 30, 30d supply, fill #0

## 2023-04-13 MED ORDER — LISDEXAMFETAMINE DIMESYLATE 60 MG PO CAPS
60.0000 mg | ORAL_CAPSULE | Freq: Every morning | ORAL | 0 refills | Status: DC
  Filled 2023-05-13: qty 30, 30d supply, fill #0

## 2023-05-12 ENCOUNTER — Other Ambulatory Visit (HOSPITAL_BASED_OUTPATIENT_CLINIC_OR_DEPARTMENT_OTHER): Payer: Self-pay

## 2023-05-13 ENCOUNTER — Other Ambulatory Visit (HOSPITAL_BASED_OUTPATIENT_CLINIC_OR_DEPARTMENT_OTHER): Payer: Self-pay

## 2023-06-23 ENCOUNTER — Other Ambulatory Visit (HOSPITAL_BASED_OUTPATIENT_CLINIC_OR_DEPARTMENT_OTHER): Payer: Self-pay

## 2023-06-23 ENCOUNTER — Other Ambulatory Visit (HOSPITAL_COMMUNITY): Payer: Self-pay

## 2023-08-13 ENCOUNTER — Other Ambulatory Visit (HOSPITAL_BASED_OUTPATIENT_CLINIC_OR_DEPARTMENT_OTHER): Payer: Self-pay

## 2023-08-13 MED ORDER — LISDEXAMFETAMINE DIMESYLATE 70 MG PO CAPS
70.0000 mg | ORAL_CAPSULE | Freq: Every day | ORAL | 0 refills | Status: DC
  Filled 2023-08-13: qty 30, 30d supply, fill #0

## 2023-08-18 ENCOUNTER — Other Ambulatory Visit (HOSPITAL_BASED_OUTPATIENT_CLINIC_OR_DEPARTMENT_OTHER): Payer: Self-pay

## 2023-09-15 ENCOUNTER — Other Ambulatory Visit (HOSPITAL_BASED_OUTPATIENT_CLINIC_OR_DEPARTMENT_OTHER): Payer: Self-pay

## 2023-09-15 MED ORDER — BUSPIRONE HCL 7.5 MG PO TABS
7.5000 mg | ORAL_TABLET | Freq: Two times a day (BID) | ORAL | 1 refills | Status: DC
  Filled 2023-09-15 – 2023-10-06 (×2): qty 60, 30d supply, fill #0

## 2023-09-15 MED ORDER — LISDEXAMFETAMINE DIMESYLATE 70 MG PO CAPS
70.0000 mg | ORAL_CAPSULE | Freq: Every day | ORAL | 0 refills | Status: DC
  Filled 2023-09-15 – 2023-10-06 (×2): qty 30, 30d supply, fill #0

## 2023-09-25 ENCOUNTER — Other Ambulatory Visit (HOSPITAL_BASED_OUTPATIENT_CLINIC_OR_DEPARTMENT_OTHER): Payer: Self-pay

## 2023-09-25 MED ORDER — ETONOGESTREL-ETHINYL ESTRADIOL 0.12-0.015 MG/24HR VA RING
VAGINAL_RING | VAGINAL | 0 refills | Status: AC
  Filled 2023-09-25 – 2023-09-29 (×2): qty 3, 28d supply, fill #0
  Filled 2023-12-17: qty 3, 84d supply, fill #1
  Filled 2023-12-18: qty 1, 28d supply, fill #1

## 2023-09-27 ENCOUNTER — Other Ambulatory Visit (HOSPITAL_BASED_OUTPATIENT_CLINIC_OR_DEPARTMENT_OTHER): Payer: Self-pay

## 2023-09-27 MED ORDER — FLUCONAZOLE 150 MG PO TABS
150.0000 mg | ORAL_TABLET | Freq: Once | ORAL | 0 refills | Status: AC
  Filled 2023-09-27 – 2023-09-29 (×2): qty 1, 1d supply, fill #0

## 2023-09-28 ENCOUNTER — Other Ambulatory Visit (HOSPITAL_BASED_OUTPATIENT_CLINIC_OR_DEPARTMENT_OTHER): Payer: Self-pay

## 2023-09-29 ENCOUNTER — Other Ambulatory Visit (HOSPITAL_BASED_OUTPATIENT_CLINIC_OR_DEPARTMENT_OTHER): Payer: Self-pay

## 2023-09-29 ENCOUNTER — Other Ambulatory Visit: Payer: Self-pay

## 2023-10-06 ENCOUNTER — Other Ambulatory Visit: Payer: Self-pay

## 2023-10-06 ENCOUNTER — Other Ambulatory Visit (HOSPITAL_BASED_OUTPATIENT_CLINIC_OR_DEPARTMENT_OTHER): Payer: Self-pay

## 2023-10-06 ENCOUNTER — Other Ambulatory Visit (HOSPITAL_COMMUNITY): Payer: Self-pay

## 2023-10-06 MED ORDER — VITAMIN D3 1.25 MG (50000 UT) PO CAPS
50000.0000 [IU] | ORAL_CAPSULE | ORAL | 0 refills | Status: AC
Start: 2023-10-05 — End: ?
  Filled 2023-10-06: qty 12, 84d supply, fill #0

## 2023-10-07 ENCOUNTER — Other Ambulatory Visit (HOSPITAL_BASED_OUTPATIENT_CLINIC_OR_DEPARTMENT_OTHER): Payer: Self-pay

## 2023-10-07 MED ORDER — AMOXICILLIN-POT CLAVULANATE 875-125 MG PO TABS
1.0000 | ORAL_TABLET | Freq: Two times a day (BID) | ORAL | 0 refills | Status: DC
  Filled 2023-10-07: qty 20, 10d supply, fill #0

## 2023-10-09 ENCOUNTER — Encounter (INDEPENDENT_AMBULATORY_CARE_PROVIDER_SITE_OTHER): Payer: Self-pay

## 2023-10-13 ENCOUNTER — Other Ambulatory Visit (HOSPITAL_BASED_OUTPATIENT_CLINIC_OR_DEPARTMENT_OTHER): Payer: Self-pay

## 2023-10-13 ENCOUNTER — Encounter (HOSPITAL_COMMUNITY): Payer: Self-pay

## 2023-10-13 ENCOUNTER — Emergency Department (HOSPITAL_COMMUNITY)
Admission: EM | Admit: 2023-10-13 | Discharge: 2023-10-14 | Disposition: A | Discharge status: Home / Self Care | Payer: Self-pay | Attending: Emergency Medicine | Admitting: Emergency Medicine

## 2023-10-13 DIAGNOSIS — R55 Syncope and collapse: Secondary | ICD-10-CM | POA: Diagnosis present

## 2023-10-13 LAB — CBC
HCT: 35.8 % — ABNORMAL LOW (ref 36.0–46.0)
Hemoglobin: 12.1 g/dL (ref 12.0–15.0)
MCH: 31.3 pg (ref 26.0–34.0)
MCHC: 33.8 g/dL (ref 30.0–36.0)
MCV: 92.5 fL (ref 80.0–100.0)
Platelets: 232 K/uL (ref 150–400)
RBC: 3.87 MIL/uL (ref 3.87–5.11)
RDW: 12.4 % (ref 11.5–15.5)
WBC: 8.3 K/uL (ref 4.0–10.5)
nRBC: 0 % (ref 0.0–0.2)

## 2023-10-13 LAB — CBG MONITORING, ED: Glucose-Capillary: 101 mg/dL — ABNORMAL HIGH (ref 70–99)

## 2023-10-13 MED ORDER — FLUCONAZOLE 150 MG PO TABS
150.0000 mg | ORAL_TABLET | Freq: Once | ORAL | 0 refills | Status: AC
  Filled 2023-10-13: qty 1, 1d supply, fill #0

## 2023-10-13 NOTE — ED Triage Notes (Signed)
 Pt comes via [facility] from home for dizziness and near syncopal episode, upon EMS arrival BP was 90/60 now 130 systolic, pt is on antibiotics for strep

## 2023-10-14 LAB — COMPREHENSIVE METABOLIC PANEL WITH GFR
ALT: 17 U/L (ref 0–44)
AST: 16 U/L (ref 15–41)
Albumin: 3.8 g/dL (ref 3.5–5.0)
Alkaline Phosphatase: 58 U/L (ref 38–126)
Anion gap: 12 (ref 5–15)
BUN: 15 mg/dL (ref 6–20)
CO2: 21 mmol/L — ABNORMAL LOW (ref 22–32)
Calcium: 8.7 mg/dL — ABNORMAL LOW (ref 8.9–10.3)
Chloride: 106 mmol/L (ref 98–111)
Creatinine, Ser: 0.64 mg/dL (ref 0.44–1.00)
GFR, Estimated: >60 mL/min (ref >60)
Glucose, Bld: 101 mg/dL — ABNORMAL HIGH (ref 70–99)
Potassium: 4 mmol/L (ref 3.5–5.1)
Sodium: 139 mmol/L (ref 135–145)
Total Bilirubin: 0.5 mg/dL (ref 0.0–1.2)
Total Protein: 6.7 g/dL (ref 6.5–8.1)

## 2023-10-14 LAB — HCG, SERUM, QUALITATIVE: Preg, Serum: NEGATIVE

## 2023-10-14 NOTE — Discharge Instructions (Addendum)
 You were evaluated in the Emergency Department and after careful evaluation, we did not find any emergent condition requiring admission or further testing in the hospital.  Your exam/testing today is overall reassuring.  Recommend follow-up with cardiology if you continue to have syncopal or near syncopal episodes.  Drink plenty of water and take your time with change of positions to help prevent syncopal episodes.  Please return to the Emergency Department if you experience any worsening of your condition.   Thank you for allowing us  to be a part of your care.

## 2023-10-14 NOTE — ED Provider Notes (Signed)
 [facility]EMERGENCY DEPT [facility] Emergency Department Provider Note MRN:  [id number]  Arrival date & time: 10/14/23     Chief Complaint   Near Syncope   History of Present Illness   Andrea Cardenas is a 23 y.o. year-old female with no pertinent past medical history presenting to the ED with chief complaint of near syncope.  Patient was bending down to tend to the dog and then stood up and felt very lightheaded, vision went black, almost passed out.  Sat down and usually this helps but it did not really help.  Here for evaluation.  Has passed out completely 3 times in the past, has had very frequent near syncopal episodes.  Thinks she might have POTS.  Review of Systems  A thorough review of systems was obtained and all systems are negative except as noted in the HPI and PMH.   Patient's Health History    Past Medical History:  Diagnosis Date   PCOS (polycystic ovarian syndrome)     History reviewed. No pertinent surgical history.  History reviewed. No pertinent family history.  Social History   Socioeconomic History   Marital status: Unknown    Spouse name: Not on file   Number of children: Not on file   Years of education: Not on file   Highest education level: Not on file  Occupational History   Not on file  Tobacco Use   Smoking status: Never   Smokeless tobacco: Not on file  Substance and Sexual Activity   Alcohol use: Yes   Drug use: Never   Sexual activity: Not on file  Other Topics Concern   Not on file  Social History Narrative   Not on file   Social Drivers of Health   Financial Resource Strain: Not on file  Food Insecurity: Not on file  Transportation Needs: Not on file  Physical Activity: Not on file  Stress: Not on file  Social Connections: Unknown (06/11/2021)   Received from [facility]   Social Network    Social Network: Not on file  Intimate Partner Violence: Unknown (05/03/2021)   Received from [facility]   HITS    Physically  Hurt: Not on file    Insult or Talk Down To: Not on file    Threaten Physical Harm: Not on file    Scream or Curse: Not on file     Physical Exam   Vitals:   10/13/23 2310 10/13/23 2315  BP:  119/76  Pulse:  71  Resp:  16  Temp:  98 F (36.7 C)  SpO2: 100% 100%    CONSTITUTIONAL: Well-appearing, NAD NEURO/PSYCH:  Alert and oriented x 3, no focal deficits EYES:  eyes equal and reactive ENT/NECK:  no LAD, no JVD CARDIO: Regular rate, well-perfused, normal S1 and S2 PULM:  CTAB no wheezing or rhonchi GI/GU:  non-distended, non-tender MSK/SPINE:  No gross deformities, no edema SKIN:  no rash, atraumatic   *Additional and/or pertinent findings included in MDM below  Diagnostic and Interventional Summary    EKG Interpretation Date/Time:  Tuesday October 13 2023 23:21:46 EDT Ventricular Rate:  66 PR Interval:  137 QRS Duration:  99 QT Interval:  395 QTC Calculation: 414 R Axis:   79  Text Interpretation: Sinus rhythm Confirmed by Theadore Sharper [phone]) on 10/14/2023 2:00:20 AM       Labs Reviewed  COMPREHENSIVE METABOLIC PANEL WITH GFR - Abnormal; Notable for the following components:      Result Value   CO2  21 (*)    Glucose, Bld 101 (*)    Calcium 8.7 (*)    All other components within normal limits  CBC - Abnormal; Notable for the following components:   HCT 35.8 (*)    All other components within normal limits  CBG MONITORING, ED - Abnormal; Notable for the following components:   Glucose-Capillary 101 (*)    All other components within normal limits  HCG, SERUM, QUALITATIVE  URINALYSIS, ROUTINE W REFLEX MICROSCOPIC    No orders to display    Medications - No data to display   Procedures  /  Critical Care Procedures  ED Course and Medical Decision Making  Initial Impression and Ddx Well-appearing in no acute distress with no symptoms at this time, had some orthostatic hypotension most likely.  Patient thinks that she may have POTS which is possible  given the positional nature of her symptoms.  Other considerations include arrhythmia, anemia, electrolyte disturbance.  Past medical/surgical history that increases complexity of ED encounter: None  Interpretation of Diagnostics I personally reviewed the EKG and my interpretation is as follows: Sinus rhythm with no evidence of Brugada or long QT or other concerns  No significant blood count or electrolyte disturbance.  Patient Reassessment and Ultimate Disposition/Management     Blood pressure is normal here in the emergency department, with reassuring workup patient is appropriate for discharge.  Patient management required discussion with the following services or consulting groups:  None  Complexity of Problems Addressed Acute illness or injury that poses threat of life of bodily function  Additional Data Reviewed and Analyzed Further history obtained from: None  Additional Factors Impacting ED Encounter Risk None  Ozell HERO. Theadore, MD [facility] Emergency Medicine [facility] [email or web address]  Final Clinical Impressions(s) / ED Diagnoses     ICD-10-CM   1. Near syncope  R55 Ambulatory referral to Cardiology      ED Discharge Orders          Ordered    Ambulatory referral to Cardiology        10/14/23 0234             Discharge Instructions Discussed with and Provided to Patient:    Discharge Instructions      You were evaluated in the Emergency Department and after careful evaluation, we did not find any emergent condition requiring admission or further testing in the hospital.  Your exam/testing today is overall reassuring.  Recommend follow-up with cardiology if you continue to have syncopal or near syncopal episodes.  Drink plenty of water and take your time with change of positions to help prevent syncopal episodes.  Please return to the Emergency Department if you experience any worsening of your condition.   Thank you for  allowing us  to be a part of your care.      Theadore Ozell HERO, MD 10/14/23 [phone]

## 2023-10-28 ENCOUNTER — Other Ambulatory Visit (HOSPITAL_BASED_OUTPATIENT_CLINIC_OR_DEPARTMENT_OTHER): Payer: Self-pay

## 2023-11-02 ENCOUNTER — Ambulatory Visit (INDEPENDENT_AMBULATORY_CARE_PROVIDER_SITE_OTHER): Admitting: Otolaryngology

## 2023-11-02 ENCOUNTER — Encounter (INDEPENDENT_AMBULATORY_CARE_PROVIDER_SITE_OTHER): Payer: Self-pay | Admitting: Otolaryngology

## 2023-11-02 VITALS — BP 114/76 | HR 76

## 2023-11-02 DIAGNOSIS — J020 Streptococcal pharyngitis: Secondary | ICD-10-CM

## 2023-11-02 DIAGNOSIS — J3501 Chronic tonsillitis: Secondary | ICD-10-CM

## 2023-11-02 DIAGNOSIS — J351 Hypertrophy of tonsils: Secondary | ICD-10-CM | POA: Diagnosis not present

## 2023-11-02 NOTE — Progress Notes (Signed)
 ENT CONSULT:  Reason for Consult: recurrent strep   HPI: Discussed the use of AI scribe software for clinical note transcription with the patient, who gave verbal consent to proceed.  History of Present Illness Andrea Cardenas is a 23 year old female who presents with recurrent sore throat and history of frequent streptococcal infections.  She has experienced three episodes of strep throat in the past month and a half, with the most recent episode treated with antibiotics completed a week ago. Although she feels the strep infection is resolved, her tonsils appear abnormal, and she sometimes experiences difficulty swallowing with a sensation of pressure.  She has a history of frequent strep throat infections since childhood, and there was consideration for tonsillectomy in the past, which was not performed. She has not had any other significant surgeries except for wisdom teeth removal, which had a prolonged recovery.  She is scheduled for an evaluation for POTS on November 17th. She describes symptoms of significant drops in blood pressure, lightheadedness upon standing, and a high heart rate with light to moderate activity. She was in the ER a couple of weeks ago due to a significant drop in blood pressure.  Records Reviewed:  ED visit 10/13/23 Andrea Cardenas is a 23 y.o. year-old female with no pertinent past medical history presenting to the ED with chief complaint of near syncope.   Patient was bending down to tend to the dog and then stood up and felt very lightheaded, vision went black, almost passed out.  Sat down and usually this helps but it did not really help.  Here for evaluation.  Has passed out completely 3 times in the past, has had very frequent near syncopal episodes.  Thinks she might have POTS.    Past Medical History:  Diagnosis Date   PCOS (polycystic ovarian syndrome)     History reviewed. No pertinent surgical history.  History reviewed. No pertinent family  history.  Social History:  reports that she has never smoked. She does not have any smokeless tobacco history on file. She reports current alcohol use. She reports that she does not use drugs.  Allergies: No Known Allergies  Medications: I have reviewed the patient's current medications.  The PMH, PSH, Medications, Allergies, and SH were reviewed and updated.  ROS: Constitutional: Negative for fever, weight loss and weight gain. Cardiovascular: Negative for chest pain and dyspnea on exertion. Respiratory: Is not experiencing shortness of breath at rest. Gastrointestinal: Negative for nausea and vomiting. Neurological: Negative for headaches. Psychiatric: The patient is not nervous/anxious  Blood pressure 114/76, pulse 76, last menstrual period 08/25/2023, SpO2 97%.  PHYSICAL EXAM:  Exam: General: Well-developed, well-nourished Respiratory Respiratory effort: Equal inspiration and expiration without stridor Cardiovascular Peripheral Vascular: Warm extremities with equal color/perfusion Eyes: No nystagmus with equal extraocular motion bilaterally Neuro/Psych/Balance: Patient oriented to person, place, and time; Appropriate mood and affect; Gait is intact with no imbalance; Cranial nerves I-XII are intact Head and Face Inspection: Normocephalic and atraumatic without mass or lesion Palpation: Facial skeleton intact without bony stepoffs Salivary Glands: No mass or tenderness Facial Strength: Facial motility symmetric and full bilaterally ENT Pinna: External ear intact and fully developed External canal: Canal is patent with intact skin Tympanic Membrane: Clear and mobile External Nose: No scar or anatomic deformity Internal Nose: Septum intact and midline. No edema, polyp, or rhinorrhea Lips, Teeth, and gums: Mucosa and teeth intact and viable TMJ: No pain to palpation with full mobility Oral cavity/oropharynx: No erythema or exudate,  no lesions present 3+ tonsils Neck Neck  and Trachea: Midline trachea without mass or lesion Thyroid: No mass or nodularity Lymphatics: No lymphadenopathy  Labs: normal CBC 2 weeks ago   Assessment/Plan: Encounter Diagnoses  Name Primary?   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 Considered tonsillectomy due to recurrent infections and hypertrophy on exam. Discussed post-tonsillectomy bleeding risk (4%) and potential for increased post-operative pain due to age group. Advised to wait for POTS evaluation before surgery due to bleeding risk. Emphasized post-operative hydration. - Schedule tonsillectomy after POTS evaluation on November 17th. - Advise to maintain hydration, especially post-operatively. - Instruct to notify if acutely ill before the procedure.    Thank you for allowing me to participate in the care of this patient. Please do not hesitate to contact me with any questions or concerns.   Elena Larry, MD Otolaryngology [facility] Phone: [phone] Fax: [phone]    11/02/2023, 1:53 PM

## 2023-11-09 NOTE — Progress Notes (Signed)
 Cardiology Office Note   Date:  11/18/2023  ID:  Andrea Cardenas, DOB [birth year], MRN [id number] PCP: Mickie Kaufman, MD  [facility] Providers Cardiologist:  None   History of Present Illness Andrea Cardenas is a 23 y.o. female with past medical history of PCOS who presents today for evaluation of near syncope.  Had been seen in the ED at [facility] on 9/17.  Reported that she had been bending down to pet her dog, stood up and felt very lightheaded.  Felt like she almost passed out.  She sat down but symptoms did not improve significantly.  Reportedly had passed out 3 times in the past and had very near syncopal episodes.  Labs in the ED showed creatinine 0.64, WBC 8.3, hemoglobin 12.1.  EKG showed sinus rhythm with heart rate 66 bpm.  They did not obtain orthostatic vital signs in the ED.  Concern for possible POTS versus orthostatic hypotension.  Presents today for evaluation.  Patient presents today for evaluation of near syncope.  She tells me that she has had true syncopal episodes in the past, but has not had loss of consciousness for over 6 months.  She has episodes of near syncope multiple times per week.  These episodes usually occur when she is going from sitting/laying to standing.  Also can occur if she bends over and stands back up.  Describes a feeling of lightheadedness, palpitations, and feeling like her vision is starting to go dark.  Usually, if she sits down to rest her symptoms improve.  On 9/17, she had 1 of these episodes of near syncope after standing up from bending over too quickly.  After resting for a few moments, her symptoms did not improve.  Her mom tried to check her blood pressure but was unable to get a reading.  Called EMS, BP reportedly in the 90s systolic.  Patient denies chest pain or shortness of breath.  She does have occasional episodes of palpitations.  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  Able to tolerate  activity well.  She does not smoke, drinks alcohol socially.  She has been looking into her symptoms and was concerned about POTS.  Notes that when she was walking around, her heart rate can get into the 120s.  She is also concerned that she might have Earls Danlos due to hypermobility, joint pain.  Studies Reviewed  Risk Assessment/Calculations           Physical Exam VS:  BP 115/80   Pulse 76   Ht 5' 6 (1.676 m)   Wt 161 lb (73 kg)   SpO2 100%   BMI 25.99 kg/m   Orthostatic VS for the past 24 hrs (Last 3 readings):  BP- Lying Pulse- Lying BP- Sitting Pulse- Sitting BP- Standing at 0 minutes Pulse- Standing at 0 minutes BP- Standing at 3 minutes Pulse- Standing at 3 minutes  11/18/23 0854 -- -- -- -- -- -- 127/88 83  11/18/23 0851 -- -- -- -- -- -- 127/88 83  11/18/23 0838 119/73 68 128/87 75 126/86 74 127/88 83      Wt Readings from Last 3 Encounters:  11/18/23 161 lb (73 kg)  11/01/22 158 lb (71.7 kg)  12/16/19 181 lb (82.1 kg)    GEN: Well nourished, well developed in no acute distress.  Sitting comfortably on the exam table NECK: No JVD  CARDIAC:  RRR, no murmurs, rubs, gallops.  Radial pulses  2+ bilaterally RESPIRATORY:  Clear to auscultation without rales, wheezing or rhonchi.  Normal work of breathing on room air ABDOMEN: Soft, non-tender, non-distended EXTREMITIES:  No edema in BLE; No deformity   ASSESSMENT AND PLAN  Near Syncope  Palpitations  - Patient tells me that she has passed out multiple times in the past. Has not lost consciousness for >6 months. She has multiple episodes of near syncope per week. No chest pain, SOB  - Episodes of near syncope are almost always associated with bending over to standing or sitting/laying to standing. Describes feeling lightheaded, palpitations, and like her vision is starting to fade.  - She also has palpitations multiple times per week that are not positional  - Orthostatic vital signs today negative- BP and HR only mildly  changed with position  - TSH, hemoglobin, potassium, and creatinine all within normal limits in 09/2023. EKG 9/16 showed NSR with HR 66 BPM  - Ordered 14 day preventice (patient requires hypoallergenic monitor)  - Ordered echocardiogram  - Patient concerned that she may have POTs. Notes that her HR usually increases when she stands. Discussed that her vital signs in clinic did not support this, but encouraged oral hydration, increased salt intake, use of compression. She does note that her symptoms improve if she wears compression leggings. Also encouraged recumbent exercises    Dispo: Follow up with Dr. Anner in 3 months   Signed, Rollo FABIENE Louder, PA-C

## 2023-11-18 ENCOUNTER — Ambulatory Visit: Attending: Cardiology

## 2023-11-18 ENCOUNTER — Encounter: Payer: Self-pay | Admitting: Cardiology

## 2023-11-18 ENCOUNTER — Ambulatory Visit: Payer: Self-pay | Attending: Cardiovascular Disease | Admitting: Cardiology

## 2023-11-18 VITALS — BP 115/80 | HR 76 | Ht 66.0 in | Wt 161.0 lb

## 2023-11-18 DIAGNOSIS — R55 Syncope and collapse: Secondary | ICD-10-CM

## 2023-11-18 DIAGNOSIS — R002 Palpitations: Secondary | ICD-10-CM

## 2023-11-18 NOTE — Patient Instructions (Addendum)
 Medication Instructions:  NO CHANGES  Lab Work: NONE TO BE DONE TODAY.  Testing/Procedures: Your physician has requested that you have an echocardiogram. Echocardiography is a painless test that uses sound waves to create images of your heart. It provides your doctor with information about the size and shape of your heart and how well your heart's chambers and valves are working. This procedure takes approximately one hour. There are no restrictions for this procedure. Please do NOT wear cologne, perfume, aftershave, or lotions (deodorant is allowed). Please arrive 15 minutes prior to your appointment time.  Please note: We ask at that you not bring children with you during ultrasound (echo/ vascular) testing. Due to room size and safety concerns, children are not allowed in the ultrasound rooms during exams. Our front office staff cannot provide observation of children in our lobby area while testing is being conducted. An adult accompanying a patient to their appointment will only be allowed in the ultrasound room at the discretion of the ultrasound technician under special circumstances. We apologize for any inconvenience.   ZIO XT- Long Term Monitor Instructions  Your physician has requested you wear a ZIO patch monitor for 14 days.  This is a single patch monitor. [employer] supplies one patch monitor per enrollment. Additional stickers are not available. Please do not apply patch if you will be having a Nuclear Stress Test,  Echocardiogram, Cardiac CT, MRI, or Chest Xray during the period you would be wearing the  monitor. The patch cannot be worn during these tests. You cannot remove and re-apply the  ZIO XT patch monitor.  Your ZIO patch monitor will be mailed 3 day USPS to your address on file. It may take 3-5 days  to receive your monitor after you have been enrolled.  Once you have received your monitor, please review the enclosed instructions. Your monitor  has already been  registered assigning a specific monitor serial # to you.  Billing and Patient Assistance Program Information  We have supplied [employer] with any of your insurance information on file for billing purposes. [employer] offers a sliding scale Patient Assistance Program for patients that do not have  insurance, or whose insurance does not completely cover the cost of the ZIO monitor.  You must apply for the Patient Assistance Program to qualify for this discounted rate.  To apply, please call [employer] at [phone], select option 4, select option 2, ask to apply for  Patient Assistance Program. Meredeth will ask your household income, and how many people  are in your household. They will quote your out-of-pocket cost based on that information.  [employer] will also be able to set up a 7-month, interest-free payment plan if needed.  Applying the monitor   Shave hair from upper left chest.  Hold abrader disc by orange tab. Rub abrader in 40 strokes over the upper left chest as  indicated in your monitor instructions.  Clean area with 4 enclosed alcohol pads. Let dry.  Apply patch as indicated in monitor instructions. Patch will be placed under collarbone on left  side of chest with arrow pointing upward.  Rub patch adhesive wings for 2 minutes. Remove white label marked 1. Remove the white  label marked 2. Rub patch adhesive wings for 2 additional minutes.  While looking in a mirror, press and release button in center of patch. A small green light will  flash 3-4 times. This will be your only indicator that the monitor has been turned on.  Do not shower  for the first 24 hours. You may shower after the first 24 hours.  Press the button if you feel a symptom. You will hear a small click. Record Date, Time and  Symptom in the Patient Logbook.  When you are ready to remove the patch, follow instructions on the last 2 pages of Patient  Logbook. Stick patch monitor onto the last page of Patient  Logbook.  Place Patient Logbook in the blue and white box. Use locking tab on box and tape box closed  securely. The blue and white box has prepaid postage on it. Please place it in the mailbox as  soon as possible. Your physician should have your test results approximately 7 days after the  monitor has been mailed back to [facility].  Call [facility] Customer Care at [phone] if you have questions regarding  your ZIO XT patch monitor. Call them immediately if you see an orange light blinking on your  monitor.  If your monitor falls off in less than 4 days, contact our Monitor department at [phone].  If your monitor becomes loose or falls off after 4 days call [employer] at [phone] for  suggestions on securing your monitor   Follow-Up: At [facility], you and your health needs are our priority.  As part of our continuing mission to provide you with exceptional heart care, our providers are all part of one team.  This team includes your primary Cardiologist (physician) and Advanced Practice Providers or APPs (Physician Assistants and Nurse Practitioners) who all work together to provide you with the care you need, when you need it.  Your next appointment:   3-4 MONTHS  Provider:   DR. ANNER, MD

## 2023-11-18 NOTE — Progress Notes (Unsigned)
 Enrolled for [employer] to mail a ZIO XT long term holter monitor to the patients address on file.   Dr. Herbie Baltimore to read.

## 2023-11-26 ENCOUNTER — Other Ambulatory Visit (HOSPITAL_BASED_OUTPATIENT_CLINIC_OR_DEPARTMENT_OTHER): Payer: Self-pay

## 2023-11-26 MED ORDER — LISDEXAMFETAMINE DIMESYLATE 70 MG PO CAPS
70.0000 mg | ORAL_CAPSULE | Freq: Every day | ORAL | 0 refills | Status: AC
  Filled 2023-11-26: qty 30, 30d supply, fill #0

## 2023-11-27 ENCOUNTER — Other Ambulatory Visit (HOSPITAL_BASED_OUTPATIENT_CLINIC_OR_DEPARTMENT_OTHER): Payer: Self-pay

## 2023-11-30 ENCOUNTER — Ambulatory Visit (HOSPITAL_COMMUNITY): Admission: RE | Admit: 2023-11-30 | Source: Ambulatory Visit

## 2023-12-08 ENCOUNTER — Ambulatory Visit (HOSPITAL_BASED_OUTPATIENT_CLINIC_OR_DEPARTMENT_OTHER): Attending: Cardiology

## 2023-12-15 ENCOUNTER — Ambulatory Visit (HOSPITAL_BASED_OUTPATIENT_CLINIC_OR_DEPARTMENT_OTHER): Payer: Self-pay | Admitting: Cardiology

## 2023-12-18 ENCOUNTER — Other Ambulatory Visit (HOSPITAL_BASED_OUTPATIENT_CLINIC_OR_DEPARTMENT_OTHER): Payer: Self-pay

## 2023-12-21 ENCOUNTER — Other Ambulatory Visit (HOSPITAL_BASED_OUTPATIENT_CLINIC_OR_DEPARTMENT_OTHER): Payer: Self-pay

## 2024-01-01 ENCOUNTER — Other Ambulatory Visit (HOSPITAL_BASED_OUTPATIENT_CLINIC_OR_DEPARTMENT_OTHER): Payer: Self-pay

## 2024-03-07 ENCOUNTER — Ambulatory Visit: Admitting: Cardiology
